# Patient Record
Sex: Male | Born: 1990 | Hispanic: No | Marital: Single | Smoking: Never smoker
Health system: Southern US, Community
[De-identification: ages and names within clinical notes are randomized; demographics above are authoritative.]

## PROBLEM LIST (undated history)

## (undated) DIAGNOSIS — T4145XA Adverse effect of unspecified anesthetic, initial encounter: Secondary | ICD-10-CM

## (undated) DIAGNOSIS — Z8489 Family history of other specified conditions: Secondary | ICD-10-CM

## (undated) DIAGNOSIS — T8859XA Other complications of anesthesia, initial encounter: Secondary | ICD-10-CM

## (undated) DIAGNOSIS — R112 Nausea with vomiting, unspecified: Secondary | ICD-10-CM

## (undated) DIAGNOSIS — Z789 Other specified health status: Secondary | ICD-10-CM

## (undated) DIAGNOSIS — Z9889 Other specified postprocedural states: Secondary | ICD-10-CM

## (undated) HISTORY — PX: ANTERIOR CRUCIATE LIGAMENT REPAIR: SHX115

---

## 2011-02-03 ENCOUNTER — Emergency Department (HOSPITAL_COMMUNITY)
Admission: EM | Admit: 2011-02-03 | Discharge: 2011-02-03 | Disposition: A | Discharge status: Home / Self Care | Payer: PRIVATE HEALTH INSURANCE | Attending: Emergency Medicine | Admitting: Emergency Medicine

## 2011-02-03 DIAGNOSIS — R079 Chest pain, unspecified: Secondary | ICD-10-CM | POA: Insufficient documentation

## 2011-02-03 DIAGNOSIS — I491 Atrial premature depolarization: Secondary | ICD-10-CM | POA: Insufficient documentation

## 2011-02-03 LAB — POCT I-STAT TROPONIN I: Troponin i, poc: 0.03 ng/mL (ref 0.00–0.08)

## 2011-02-03 LAB — COMPREHENSIVE METABOLIC PANEL
Albumin: 4.4 g/dL (ref 3.5–5.2)
BUN: 20 mg/dL (ref 6–23)
Creatinine, Ser: 0.99 mg/dL (ref 0.50–1.35)
Total Protein: 7.4 g/dL (ref 6.0–8.3)

## 2011-02-03 LAB — DIFFERENTIAL
Basophils Absolute: 0 10*3/uL (ref 0.0–0.1)
Basophils Relative: 0 % (ref 0–1)
Eosinophils Absolute: 0.1 10*3/uL (ref 0.0–0.7)
Monocytes Relative: 9 % (ref 3–12)
Neutrophils Relative %: 50 % (ref 43–77)

## 2011-02-03 LAB — CBC
MCH: 33.1 pg (ref 26.0–34.0)
MCHC: 34.9 g/dL (ref 30.0–36.0)
Platelets: 205 10*3/uL (ref 150–400)
RBC: 4.35 MIL/uL (ref 4.22–5.81)

## 2011-02-03 LAB — MAGNESIUM: Magnesium: 1.9 mg/dL (ref 1.5–2.5)

## 2011-02-05 ENCOUNTER — Emergency Department (HOSPITAL_COMMUNITY): Payer: PRIVATE HEALTH INSURANCE

## 2011-02-05 ENCOUNTER — Emergency Department (HOSPITAL_COMMUNITY)
Admission: EM | Admit: 2011-02-05 | Discharge: 2011-02-05 | Disposition: A | Discharge status: Home / Self Care | Payer: PRIVATE HEALTH INSURANCE | Attending: Emergency Medicine | Admitting: Emergency Medicine

## 2011-02-05 DIAGNOSIS — R002 Palpitations: Secondary | ICD-10-CM | POA: Insufficient documentation

## 2011-02-05 LAB — POCT I-STAT, CHEM 8
BUN: 22 mg/dL (ref 6–23)
Chloride: 100 mEq/L (ref 96–112)
Potassium: 4 mEq/L (ref 3.5–5.1)
Sodium: 151 mEq/L — ABNORMAL HIGH (ref 135–145)

## 2012-02-06 ENCOUNTER — Other Ambulatory Visit: Payer: Self-pay | Admitting: Sports Medicine

## 2012-02-06 DIAGNOSIS — M25561 Pain in right knee: Secondary | ICD-10-CM

## 2012-02-07 ENCOUNTER — Other Ambulatory Visit: Payer: PRIVATE HEALTH INSURANCE

## 2012-02-07 ENCOUNTER — Ambulatory Visit
Admission: RE | Admit: 2012-02-07 | Discharge: 2012-02-07 | Disposition: A | Discharge status: Home / Self Care | Payer: Federal, State, Local not specified - PPO | Source: Ambulatory Visit | Attending: Sports Medicine | Admitting: Sports Medicine

## 2012-02-07 DIAGNOSIS — M25561 Pain in right knee: Secondary | ICD-10-CM

## 2014-04-23 ENCOUNTER — Inpatient Hospital Stay (HOSPITAL_COMMUNITY)
Admission: AD | Admit: 2014-04-23 | Discharge: 2014-04-24 | DRG: 205 | Disposition: A | Discharge status: Home / Self Care | Payer: Federal, State, Local not specified - PPO | Source: Ambulatory Visit | Attending: Internal Medicine | Admitting: Internal Medicine

## 2014-04-23 ENCOUNTER — Encounter (HOSPITAL_COMMUNITY): Payer: Self-pay | Admitting: Radiology

## 2014-04-23 ENCOUNTER — Other Ambulatory Visit: Payer: Federal, State, Local not specified - PPO

## 2014-04-23 ENCOUNTER — Inpatient Hospital Stay (HOSPITAL_COMMUNITY): Payer: Federal, State, Local not specified - PPO

## 2014-04-23 DIAGNOSIS — J9601 Acute respiratory failure with hypoxia: Secondary | ICD-10-CM | POA: Diagnosis present

## 2014-04-23 DIAGNOSIS — R0902 Hypoxemia: Secondary | ICD-10-CM | POA: Diagnosis present

## 2014-04-23 DIAGNOSIS — Z9889 Other specified postprocedural states: Secondary | ICD-10-CM

## 2014-04-23 DIAGNOSIS — Y839 Surgical procedure, unspecified as the cause of abnormal reaction of the patient, or of later complication, without mention of misadventure at the time of the procedure: Secondary | ICD-10-CM | POA: Diagnosis present

## 2014-04-23 DIAGNOSIS — J9589 Other postprocedural complications and disorders of respiratory system, not elsewhere classified: Secondary | ICD-10-CM | POA: Diagnosis present

## 2014-04-23 DIAGNOSIS — R0600 Dyspnea, unspecified: Secondary | ICD-10-CM | POA: Diagnosis present

## 2014-04-23 HISTORY — DX: Adverse effect of unspecified anesthetic, initial encounter: T41.45XA

## 2014-04-23 HISTORY — DX: Other specified postprocedural states: Z98.890

## 2014-04-23 HISTORY — PX: ANTERIOR CRUCIATE LIGAMENT REPAIR: SHX115

## 2014-04-23 HISTORY — DX: Other specified health status: Z78.9

## 2014-04-23 HISTORY — DX: Other complications of anesthesia, initial encounter: T88.59XA

## 2014-04-23 HISTORY — DX: Nausea with vomiting, unspecified: R11.2

## 2014-04-23 HISTORY — DX: Family history of other specified conditions: Z84.89

## 2014-04-23 LAB — COMPREHENSIVE METABOLIC PANEL
ALBUMIN: 4.2 g/dL (ref 3.5–5.2)
ALT: 21 U/L (ref 0–53)
AST: 17 U/L (ref 0–37)
Alkaline Phosphatase: 51 U/L (ref 39–117)
Anion gap: 13 (ref 5–15)
BUN: 14 mg/dL (ref 6–23)
CO2: 25 mEq/L (ref 19–32)
CREATININE: 0.89 mg/dL (ref 0.50–1.35)
Calcium: 9.8 mg/dL (ref 8.4–10.5)
Chloride: 102 mEq/L (ref 96–112)
GFR calc Af Amer: >90 mL/min (ref >90)
GFR calc non Af Amer: >90 mL/min (ref >90)
Glucose, Bld: 116 mg/dL — ABNORMAL HIGH (ref 70–99)
Potassium: 3.8 mEq/L (ref 3.7–5.3)
Sodium: 140 mEq/L (ref 137–147)
Total Bilirubin: 0.4 mg/dL (ref 0.3–1.2)
Total Protein: 7.3 g/dL (ref 6.0–8.3)

## 2014-04-23 LAB — CBC WITH DIFFERENTIAL/PLATELET
Basophils Absolute: 0 10*3/uL (ref 0.0–0.1)
Basophils Relative: 0 % (ref 0–1)
EOS ABS: 0 10*3/uL (ref 0.0–0.7)
EOS PCT: 0 % (ref 0–5)
HEMATOCRIT: 40.1 % (ref 39.0–52.0)
Hemoglobin: 14.3 g/dL (ref 13.0–17.0)
LYMPHS ABS: 0.7 10*3/uL (ref 0.7–4.0)
LYMPHS PCT: 8 % — AB (ref 12–46)
MCH: 33.3 pg (ref 26.0–34.0)
MCHC: 35.7 g/dL (ref 30.0–36.0)
MCV: 93.3 fL (ref 78.0–100.0)
Monocytes Absolute: 0.4 10*3/uL (ref 0.1–1.0)
Monocytes Relative: 5 % (ref 3–12)
Neutro Abs: 7.8 10*3/uL — ABNORMAL HIGH (ref 1.7–7.7)
Neutrophils Relative %: 87 % — ABNORMAL HIGH (ref 43–77)
Platelets: 167 10*3/uL (ref 150–400)
RBC: 4.3 MIL/uL (ref 4.22–5.81)
RDW: 11.7 % (ref 11.5–15.5)
WBC: 9 10*3/uL (ref 4.0–10.5)

## 2014-04-23 LAB — LACTIC ACID, PLASMA: LACTIC ACID, VENOUS: 1 mmol/L (ref 0.5–2.2)

## 2014-04-23 LAB — APTT: aPTT: 27 seconds (ref 24–37)

## 2014-04-23 LAB — PROTIME-INR
INR: 1.04 (ref 0.00–1.49)
Prothrombin Time: 13.7 seconds (ref 11.6–15.2)

## 2014-04-23 LAB — TROPONIN I

## 2014-04-23 LAB — CK TOTAL AND CKMB (NOT AT ARMC)
CK TOTAL: 205 U/L (ref 7–232)
CK, MB: 1.8 ng/mL (ref 0.3–4.0)
Relative Index: 0.9 (ref 0.0–2.5)

## 2014-04-23 LAB — D-DIMER, QUANTITATIVE (NOT AT ARMC): D DIMER QUANT: 0.44 ug{FEU}/mL (ref 0.00–0.48)

## 2014-04-23 MED ORDER — TRAMADOL HCL 50 MG PO TABS
50.0000 mg | ORAL_TABLET | Freq: Four times a day (QID) | ORAL | Status: DC | PRN
  Administered 2014-04-23 – 2014-04-24 (×2): 50 mg via ORAL

## 2014-04-23 MED ORDER — SODIUM CHLORIDE 0.9 % IV SOLN
INTRAVENOUS | Status: DC
  Administered 2014-04-23 – 2014-04-24 (×2): via INTRAVENOUS

## 2014-04-23 MED ORDER — ACETAMINOPHEN 650 MG RE SUPP: 650.0000 mg | Freq: Four times a day (QID) | RECTAL | Status: DC | PRN

## 2014-04-23 MED ORDER — ENOXAPARIN SODIUM 30 MG/0.3ML ~~LOC~~ SOLN
30.0000 mg | Freq: Two times a day (BID) | SUBCUTANEOUS | Status: DC
  Administered 2014-04-23 – 2014-04-24 (×2): 30 mg via SUBCUTANEOUS
  Filled 2014-04-23 (×4): qty 0.3

## 2014-04-23 MED ORDER — SODIUM CHLORIDE 0.9 % IJ SOLN: 3.0000 mL | Freq: Two times a day (BID) | INTRAMUSCULAR | Status: DC

## 2014-04-23 MED ORDER — SODIUM CHLORIDE 0.9 % IJ SOLN: 3.0000 mL | INTRAMUSCULAR | Status: DC | PRN

## 2014-04-23 MED ORDER — IOHEXOL 350 MG/ML SOLN
100.0000 mL | Freq: Once | INTRAVENOUS | Status: AC | PRN
  Administered 2014-04-23: 90 mL via INTRAVENOUS

## 2014-04-23 MED ORDER — SODIUM CHLORIDE 0.9 % IV SOLN: 250.0000 mL | INTRAVENOUS | Status: DC | PRN

## 2014-04-23 MED ORDER — ACETAMINOPHEN 325 MG PO TABS
650.0000 mg | ORAL_TABLET | Freq: Four times a day (QID) | ORAL | Status: DC | PRN
Start: 2014-04-23 — End: 2014-04-24

## 2014-04-23 MED ORDER — ENOXAPARIN SODIUM 40 MG/0.4ML ~~LOC~~ SOLN
40.0000 mg | Freq: Every day | SUBCUTANEOUS | Status: DC
  Filled 2014-04-23: qty 0.4

## 2014-04-23 MED ORDER — FENTANYL CITRATE 0.05 MG/ML IJ SOLN
25.0000 ug | INTRAMUSCULAR | Status: DC | PRN
  Administered 2014-04-23: 100 ug via INTRAVENOUS
  Administered 2014-04-23: 50 ug via INTRAVENOUS
  Administered 2014-04-23 (×2): 25 ug via INTRAVENOUS
  Administered 2014-04-24: 75 ug via INTRAVENOUS
  Administered 2014-04-24: 50 ug via INTRAVENOUS
  Administered 2014-04-24: 100 ug via INTRAVENOUS
  Administered 2014-04-24: 50 ug via INTRAVENOUS
  Administered 2014-04-24: 100 ug via INTRAVENOUS

## 2014-04-23 NOTE — H&P (Signed)
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 Both of previous ACL repairs he had transient hypoxia and reports work up for hypoxia which were negative. He is admitted 12/4 post third ACL repair with another hypoxic event. He is now on room air with sats of 97%. He reports hearing the nurses telling him to take deep breaths. Suspect hypoventilation secondary to sedation bu we will check labs and xrays to rule out other causes. Had chest pain acute left anterior, non radiation. NO n/v. unknown if LMA used. No gerd or vomiting.  PAST MEDICAL HISTORY :  Irregular heart rate ACL repair x 3  Prior to Admission medications   Claritin Mobic   ALL: none  FAMILY HISTORY:  No hx of thromboembolic disease less than age 23 36randma 62 post op DVT htn dm, high cholesteral  SOCIAL HISTORY:   Never smoker. Soccer coach  REVIEW OF SYSTEMS:   10 point review of system taken, please see HPI for positives and negatives. Never smoker. Soccer coach  SUBJECTIVE: chest pain , knee pain  VITAL SIGNS:    PHYSICAL EXAMINATION: General:  WNWDAAMNAD on room air Neuro:  *Grossly intact HEENT:  No JVD/LAN Cardiovascular:  HSR RRR 1/6 sys murmur apex pan Lungs:  CTA Abdomen:  Soft + bs Musculoskeletal:  Rt long leg knee immobilizer in place. Rt foot warm and pink Skin:  Warm and dry  No results for input(s): NA, K, CL, CO2, BUN, CREATININE, GLUCOSE in the last 168 hours. No results for input(s): HGB, HCT, WBC, PLT in the last 168 hours. No results found.  ASSESSMENT /  PLAN: Active Problems:   S/P ACL repair   Acute respiratory failure with hypoxia, transient post ACL repair Etiology: narcs causing ATX, neg pressure edema unlikely, PE (would be odd so acute), does not present as anesthetic reaction   Discussion 23 yo with his third ACL repair right knee with transient hypoxia. Suspect hypoventilation form anesthetics. He has has workup for in past for same problem. PCCM asked to admit and evaluate. whats unclear is the continued chest pain    Check 12 lead  LEDS for dvt, although would be odd for such acute hypercoagulable state Chest Xray rule out aspiration / neg pressure edema ( was not decribed as diff airway) Anticoagulation for dvt prophylaxis with LMWH with transition to target specific prior to discharge. Pain control, avoid narcs as able May need cardiac workup  Steve Minor ACNP [facility] Pager [phone] till 3 pm If no answer page [phone] 04/23/2014, 2:24 PM  Pulmonary and Critical Care Medicine [facility] Pager: [phone]  04/23/2014, 2:15 PM  STAFF NOTE: I, Rory Percyaniel Feinstein, MD FACP have personally reviewed patient's available data, including medical history, events of note, physical examination and test results as part of my evaluation. I have discussed with resident/NP and other care providers such as pharmacist, RN and RRT. In addition, I personally evaluated patient and elicited key findings of: No distress, complains of left chest pain not  pleuritic, he has had a normal echo in past per mom, he is super active, pcxr just back without acute dz, will CT chest, dopplers, cpk, follow temp curve, pcxr in am, labs, ecg, continued pulse ox, O2 to sats 93%, DVT prev lov 30 bid (dw ortho)  Updated pt, and mom extensive  Steve Rossettianiel J. Tyson AliasFeinstein, MD, FACP Pgr: [phone] [facility] 04/23/2014 3:44 PM

## 2014-04-23 NOTE — Plan of Care (Signed)
Problem: Phase I Progression Outcomes Goal: O2 sats > or equal 90% or at baseline Outcome: Completed/Met Date Met:  04/23/14 Goal: Dyspnea controlled at rest Outcome: Completed/Met Date Met:  04/23/14 Goal: Tolerating diet Outcome: Completed/Met Date Met:  04/23/14

## 2014-04-23 NOTE — Progress Notes (Signed)
04/23/2014 1600 Verbal order Dr. Thurston HoleWainer ok to place CPM on patient starting at 70 degrees. Order enacted. Orders also received not to perform LE dopplers until tomorrow 12/5 and not to remove dressing until that time. Order enacted and information shared with on-coming RN. Will continue to monitor patient.  Keyshon Stein, Blanchard KelchStephanie Ingold

## 2014-04-23 NOTE — Progress Notes (Signed)
Orthopedic Tech Progress Note Patient Details:  Steve CunasBrandon Elliott 1990-11-24 [id number]  CPM Right Knee CPM Right Knee: On Right Knee Flexion (Degrees): 60 Right Knee Extension (Degrees): 0   Steve MoccasinHughes, Jenniger Figiel Craig 04/23/2014, 6:57 PM

## 2014-04-23 NOTE — Progress Notes (Signed)
04/23/2014 7:00 PM Nursing note Noted order for troponin I is for i-stat machine unavailable to the floor. Lab notified and orders placed for need for Lab troponin I phlebotomy to draw. Pt. Updated on plan of care.  Jeramy Dimmick, Blanchard KelchStephanie Ingold

## 2014-04-24 DIAGNOSIS — R0600 Dyspnea, unspecified: Secondary | ICD-10-CM | POA: Diagnosis present

## 2014-04-24 NOTE — Progress Notes (Signed)
Patient given discharge instructions, AVS,and medication list, patient has paper prescriptions given previously. Will discharge home as ordered. Graham Hyun, Randall AnKristin Jessup RN

## 2014-04-24 NOTE — Plan of Care (Signed)
Problem: Discharge Progression Outcomes Goal: Dyspnea controlled Outcome: Completed/Met Date Met:  04/24/14 Goal: O2 sats > or equal 90% or at baseline Outcome: Completed/Met Date Met:  04/24/14 Goal: Home O2 if indicated Outcome: Not Applicable Date Met:  88/11/03 Goal: Able to self administer respiratory meds Outcome: Not Applicable Date Met:  15/94/58 Goal: Flu vaccine received if indicated Outcome: Not Applicable Date Met:  59/29/24 Goal: Pneumonia vaccine received if indicated Outcome: Not Applicable Date Met:  46/28/63 Goal: Independent ADLs or Home Health Care Outcome: Completed/Met Date Met:  04/24/14 Goal: Barriers To Progression Addressed/Resolved Outcome: Completed/Met Date Met:  04/24/14 Goal: Discharge plan in place and appropriate Outcome: Completed/Met Date Met:  04/24/14 Goal: Pain controlled with appropriate interventions Outcome: Adequate for Discharge Goal: Tolerating diet Outcome: Completed/Met Date Met:  04/24/14 Goal: Activity appropriate for discharge plan Outcome: Adequate for Discharge Goal: Hemodynamically stable Outcome: Completed/Met Date Met:  81/77/11 Goal: Complications resolved/controlled Outcome: Completed/Met Date Met:  04/24/14

## 2014-04-24 NOTE — Progress Notes (Signed)
Subjective:    One day s/p right knee ACL reconstruction with post operative complications of chest pain, low O2 sat and shortness of breath.  Much better today Patient reports pain as 7 on 0-10 scale.    Objective: Vital signs in last 24 hours: Temp:  [97.4 F (36.3 C)-98.5 F (36.9 C)] 98.4 F (36.9 C) (12/05 0442) Pulse Rate:  [58-74] 74 (12/05 0442) Resp:  [18] 18 (12/05 0442) BP: (138-159)/(67-72) 138/72 mmHg (12/05 0505) SpO2:  [100 %] 100 % (12/05 0442)  Intake/Output from previous day: 12/04 0701 - 12/05 0700 In: 1385 [I.V.:1385] Out: -  Intake/Output this shift:     Recent Labs  04/23/14 1505  HGB 14.3    Recent Labs  04/23/14 1505  WBC 9.0  RBC 4.30  HCT 40.1  PLT 167    Recent Labs  04/23/14 1505  NA 140  K 3.8  CL 102  CO2 25  BUN 14  CREATININE 0.89  GLUCOSE 116*  CALCIUM 9.8    Recent Labs  04/23/14 1505  INR 1.04    ABD soft Neurovascular intact Sensation intact distally Intact pulses distally Dorsiflexion/Plantar flexion intact Incision: dressing C/D/I  Assessment Active Problems:   S/P ACL repair   Acute respiratory failure with hypoxia   Hypoxia   Plan: Advance diet  Spiral CT noted MILD left lower lobe atelectasis.  No PE.  Patient feeling better today.  Have ordered incentive spirometry for the patient to do in house and continue upon discharge.  Agree with discharge today from an orthopedic stand point.  Patient has Oxycodone and Valium scripts to cover post operative pain.  Home CPM has been arrange.  Company is [employer].  They have been in contact with patient's mother.  Has a post op appt with Dr Thurston HoleWainer on Thursday.  Steve Cathey A. Gwinda PasseShepperson, PA-C Physician Assistant [facility] [phone]  04/24/2014, 7:39 AM  Pascal LuxSHEPPERSON,Steve Malcomb J 04/24/2014, 7:35 AM

## 2014-04-24 NOTE — Discharge Instructions (Signed)
[facility] [address]   [address] [place], [place] [phone] A Division of [facility] Loreta Aveaniel F. [facility], M.D.     Robert A. Thurston HoleWainer, M.D.     Lunette StandsAnna Voytek, M.D. Eulas PostJoshua P. Landau, M.D.    Buford DresserWesley R. Ibazebo, M.D. Estell HarpinJames S. Kramer, M.D. Ralene Corkimothy R. Draper, D.O.          Genene ChurnJames M. Barry Dieneswens, PA-C            Avaleigh Decuir A. Emersyn Wyss, PA-C Janace LittenBrandon Parry, OPA-C  ARTHROSCOPIC SURGERY POSTOPERATIVE INSTRUCTIONS - KNEE/ACL  You have just had and arthroscopic operation. Even though your incisions (puncture sites) are small and should heal quickly the structures inside your knee may take 6-8 weeks to heal and settle down.  This healing time is variable for each patient and will depend on what was done inside the knee at the time of surgery.  PAIN MEDICATION You will be given a prescription for pain medication. Please take this medication as written. Most patients will require medication only for a few days.  SWELLING You can expect some swelling in your knee, this is normal. Applying EZY-GEL Wrap (cold packs) and elevating your leg will help keep the swelling to a minimum. Your entire leg should be elevated, not just your knee. Elevate your leg to or above the level of your waist.  The cold packs should be used constantly during the first 48 hours along with continued elevation of your leg.  After that ice for at least 1 hour, 4 times per day.  DRESSING Fluid leakage is common the first few days.  Precautions to prevent staining of your clothes, bed sheets, etc. should be taken.  The fluid was used to inflate the joint during surgery and is commonly tinged red from the small amount of blood. Some bleeding or leakage from the puncture sites may occur for a few days. Do not change your dressing. Do not remove the Steri-Strips. Do not shower or get the wound wet.  SYMPTOMS TO REPORT TO YOUR DOCTOR Extreme pain. Extreme swelling.  Temperature above 101 degrees. Change in the feeling, color or movement in your toes. Redness, heat or swelling at your puncture sites.  ACTIVITY You may bend and straighten your knee as soon as it is comfortable to do so. Using your knee will help decrease swelling and help prevent stiffness, as long as you don't overdo it.  Moving your foot up and down also helps decrease swelling. There is no harm is putting weight on your leg as long as it is comfortable to do so. You will be given crutches as well. (You should not, of course, try to run or jump.)  Gradually increase your activity as comfort allows. Let your body be your guide as to how much activity you can tolerate. An increase in pain or swelling with certain activities or with increased activities may indicate you are doing too much. Back up and start building up your activity at a slower rate. Crutches and use of an immobilizer temporarily rest your knee joint, helping it to heal properly.  Use your crutches and wear your knee immobilizer whenever walking.  You may, however, put weight on your affected leg as tolerated when in the immobilizer. Remember, your successful return to normal and sporting activities depends largely on your commitment to rehabilitation and maintaining strength in your leg.  Your formal physical therapy program will start soon, but your time to begin is now. Perform  quad sets (tightening your thigh muscle) a minimum of twenty times, three sessions a day. In addition work on straight leg raises (tighten your muscle and lift your straight leg 6-8 inches off the bed for 5 seconds). Perform this routine 10-20 times during your minimum of 3 daily exercise sessions. Patellar motion helps prevent scar tissue around your kneecap.  Move your kneecap (patella) from side-to-side several times a day. Use your CPM for 6 hours a day. Start at 0 to 60 degrees increasing by 10 degrees a day until you reach 90 degrees.  To work on knee  extension, place a folded pillow under your ankle (never place pillow under just the knee) allowing your hamstrings to stretch up to 30 minutes a day.  OFFICE CHECK-UP If no major problems arise and you are progressing well we will need to see you in one week. Please call the office to make an appointment. We will remove your sutures and discuss our surgery and rehabilitation at that time.

## 2014-04-24 NOTE — Discharge Summary (Signed)
Physician Discharge Summary  Patient ID: Steve Elliott MRN: [id number] DOB/AGE: 1990/10/31 23 y.o.  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 Both of previous ACL repairs he had transient hypoxia and reports work up for hypoxia which were negative. He is admitted 12/4 post third ACL repair with another hypoxic event. He is now on room air with sats of 97%. He reports hearing the nurses telling him to take deep breaths. Suspect hypoventilation secondary to sedation bu we will check labs and xrays to rule out other causes. Had chest pain acute left anterior, non radiation. NO n/v. unknown if LMA used. No gerd or vomiting. Hospital Course:  ASSESSMENT / PLAN: Active Problems:  S/P ACL repair  Acute respiratory failure with hypoxia, transient post ACL repair Etiology: narcs causing ATX, neg pressure edema unlikely, PE (would be odd so acute), does not present as anesthetic reaction   Discussion 23 yo with his third ACL repair right knee with transient hypoxia. Suspect hypoventilation form anesthetics. He has has workup for in past for same problem. PCCM asked to admit and evaluate. whats unclear is the continued chest pain    Check 12 lead with sinus arrhythmia.  Chest Xray rule out aspiration / neg pressure edema ( was not decribed as diff airway)Neg CT chest neg. Anticoagulation for dvt prophylaxis with LMWH with transition to target specific prior to discharge. Anticoagulation not needed per orthopedics instruction. Pain control, avoid narcs as able per orthopedics. 12./5 dc home with no need to follow up with pulmonary.   Labs at discharge Lab Results  Component Value Date   CREATININE 0.89 04/23/2014   BUN 14 04/23/2014   NA 140  04/23/2014   K 3.8 04/23/2014   CL 102 04/23/2014   CO2 25 04/23/2014   Lab Results  Component Value Date   WBC 9.0 04/23/2014   HGB 14.3 04/23/2014   HCT 40.1 04/23/2014   MCV 93.3 04/23/2014   PLT 167 04/23/2014   Lab Results  Component Value Date   ALT 21 04/23/2014   AST 17 04/23/2014   ALKPHOS 51 04/23/2014   BILITOT 0.4 04/23/2014   Lab Results  Component Value Date   INR 1.04 04/23/2014    Current radiology studies Ct Angio Chest Pe W/cm &/or Wo Cm  04/23/2014   CLINICAL DATA:  Acute hypoxia following knee surgery, acute chest pressure, shortness of breath  EXAM: CT ANGIOGRAPHY CHEST WITH CONTRAST  TECHNIQUE: Multidetector CT imaging of the chest was performed using the standard protocol during bolus administration of intravenous contrast. Multiplanar CT image reconstructions and MIPs were obtained to evaluate the vascular anatomy.  CONTRAST:  90mL OMNIPAQUE IOHEXOL 350 MG/ML SOLN  COMPARISON:  04/23/2014  FINDINGS: Pulmonary arteries appear patent with no significant proximal or central hilar filling defect or pulmonary embolus. Intact thoracic aorta. No dissection. Normal heart size. No pericardial or pleural effusion. No adenopathy.  Lung windows demonstrate no acute in airspace process, pneumonia, collapse or consolidation. No interstitial process or edema. Negative pneumothorax. Minor left lower lobe subsegmental atelectasis.  Upper abdomen: No acute finding. Portion of the colon splenic flexure is superimposed between the spleen and left kidney in the left upper quadrant beneath the diaphragm.  Review of  the MIP images confirms the above findings.  IMPRESSION: Negative for significant acute pulmonary embolus by CTA.  Minor left lower lobe atelectasis.  No acute intrathoracic findings.   Electronically Signed   By: Ruel Favorsrevor  Shick M.D.   On: 04/23/2014 18:04   Portable Chest 1 View  04/23/2014   CLINICAL DATA:  Shortness of breath.  Recent knee surgery.  Hypoxia.  EXAM:  PORTABLE CHEST - 1 VIEW  COMPARISON:  02/05/2011  FINDINGS: Upper normal heart size with cardiothoracic index 54% on AP radiography.  The lungs appear clear.  No pleural effusion identified.  IMPRESSION: No active disease.   Electronically Signed   By: Herbie BaltimoreWalt  Liebkemann M.D.   On: 04/23/2014 15:13    Disposition:  01-Home or Self Care  Discharge Instructions    Discharge patient    Complete by:  As directed             Medication List    TAKE these medications        acetaminophen 500 MG tablet  Commonly known as:  TYLENOL  Take 500 mg by mouth every 6 (six) hours as needed for mild pain.           Follow-up Information    Follow up with Nilda SimmerWAINER,ROBERT A, MD On 04/29/2014.   Specialty:  Orthopedic Surgery   Why:  as schedule   Contact information:   [address] [place] [address] [phone]        Discharged Condition: good  Time spent on discharge greater than 40 minutes.  Vital signs at Discharge. Temp:  [97.4 F (36.3 C)-98.5 F (36.9 C)] 98.4 F (36.9 C) (12/05 0442) Pulse Rate:  [58-74] 74 (12/05 0442) Resp:  [18] 18 (12/05 0442) BP: (138-159)/(67-72) 138/72 mmHg (12/05 0505) SpO2:  [100 %] 100 % (12/05 0442) Office follow up Special Information or instructions. Follow up with Dr. Thurston HoleWainer as instucted. Signed: Brett CanalesSteve Minor ACNP [facility] Pager [phone] till 3 pm If no answer page [phone] 04/24/2014, 11:25 AM  Patient has been seen and examined today.  Ct chest unremarkable except for mild atx.  Lungs totally clear today, rrr.  I think he is stable for discharge from pulmonary standpoint.  Ortho has cleared as well.  Does not require pulmonary f/u as outpt.

## 2016-05-05 IMAGING — CR DG CHEST 1V PORT
1 series · 1 of 1 positions shown · non-contrast
Comparison: 02/05/2011

CLINICAL DATA: Shortness of breath.  Recent knee surgery.  Hypoxia.

EXAM:
PORTABLE CHEST - 1 VIEW

[AP]
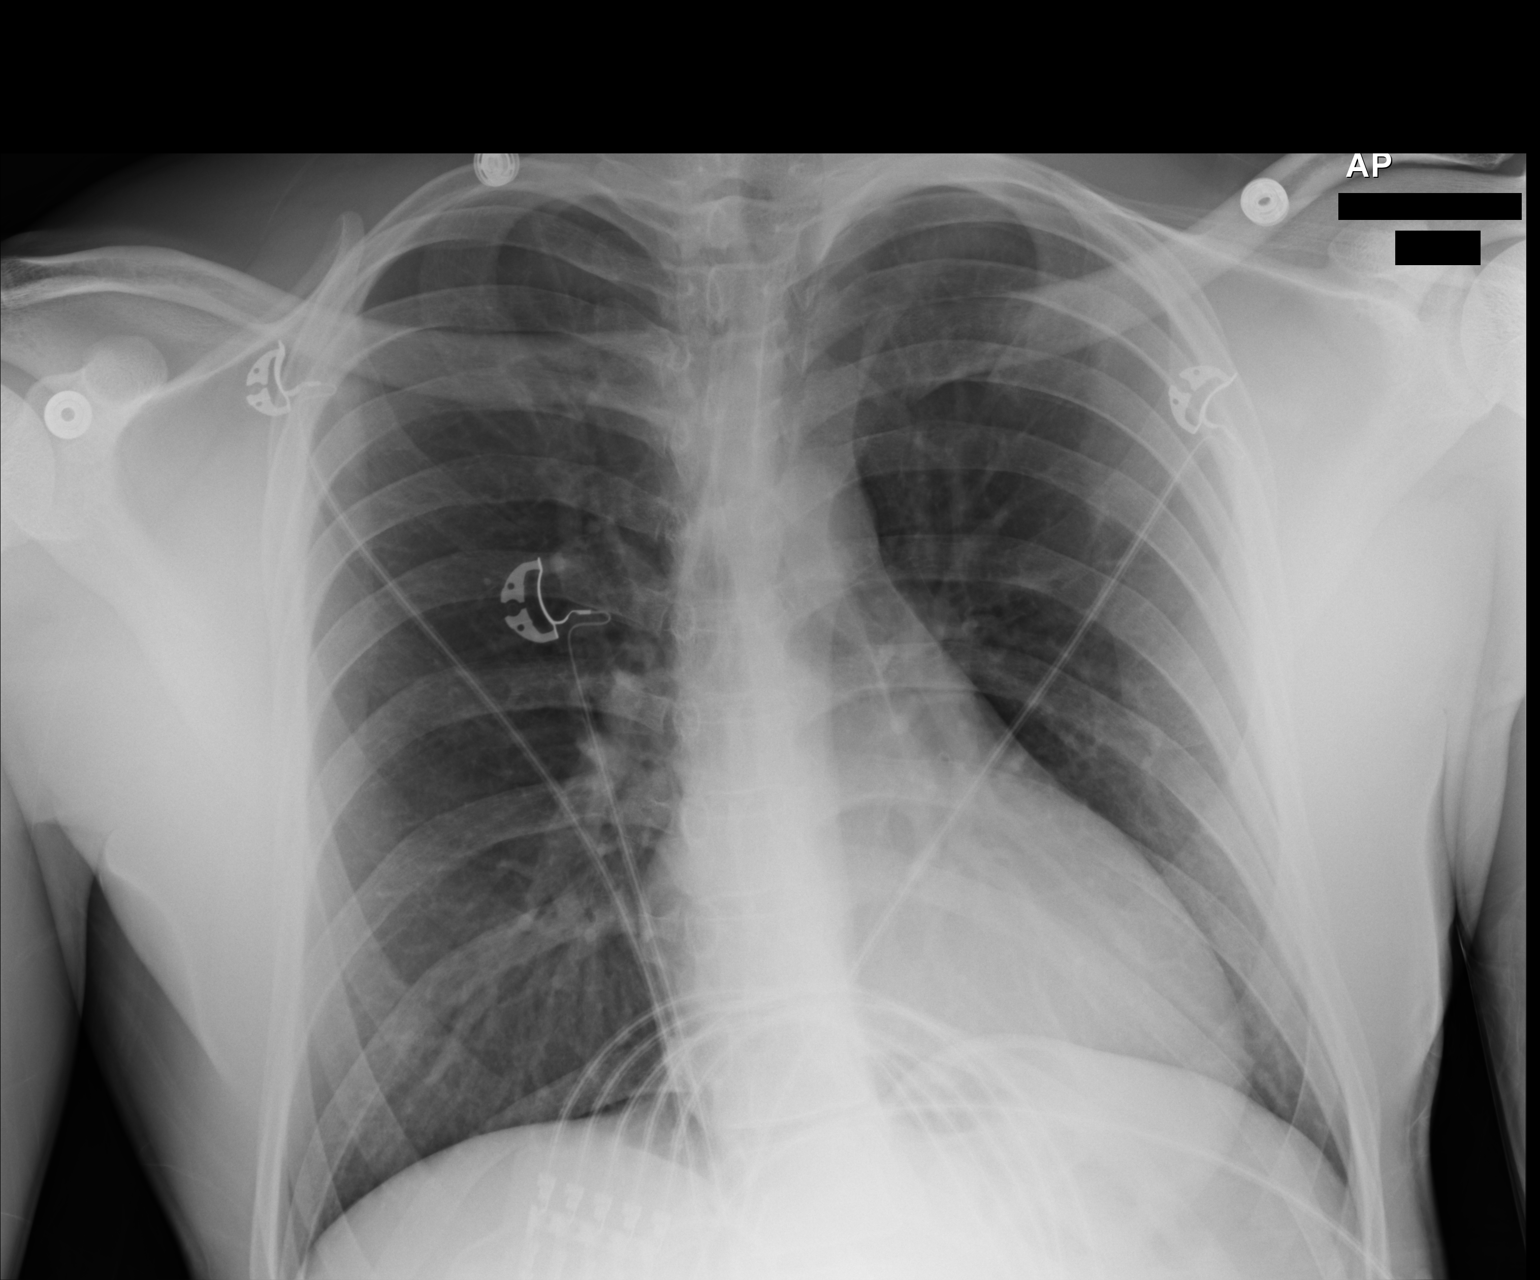

[1 of 1 positions shown; findings below may reference images not displayed]

FINDINGS: Upper normal heart size with cardiothoracic index 54% on AP
radiography.

The lungs appear clear.  No pleural effusion identified.
IMPRESSION: No active disease.

## 2016-05-05 IMAGING — CT CT ANGIO CHEST
2 of 8 series · 19 of 46 positions shown · IV contrast (Omni 300)
Comparison: 04/23/2014

CLINICAL DATA: Acute hypoxia following knee surgery, acute chest
pressure, shortness of breath

EXAM:
CT ANGIOGRAPHY CHEST WITH CONTRAST
TECHNIQUE: Multidetector CT imaging of the chest was performed using the
standard protocol during bolus administration of intravenous
contrast. Multiplanar CT image reconstructions and MIPs were
obtained to evaluate the vascular anatomy.
CONTRAST:  90mL OMNIPAQUE IOHEXOL 350 MG/ML SOLN

[Series 5: thins · axial · 0.57mm/px · z∈[+1513,+1776]mm · 16 of 291 slices shown]
[im 14/291  lung]
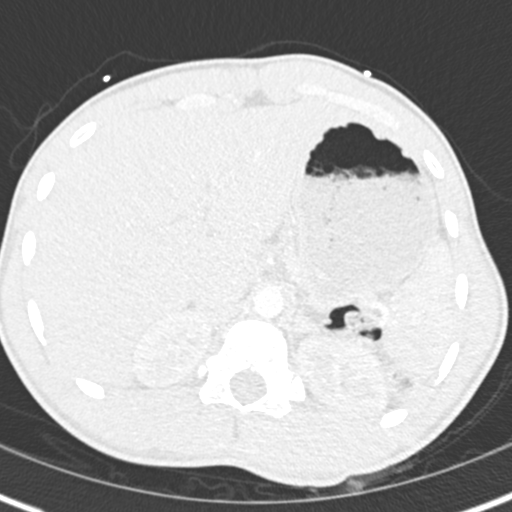
[im 27/291  soft-tissue]
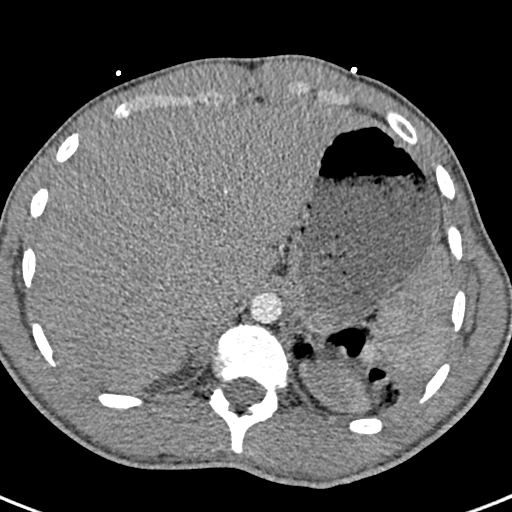
[im 53/291  lung]
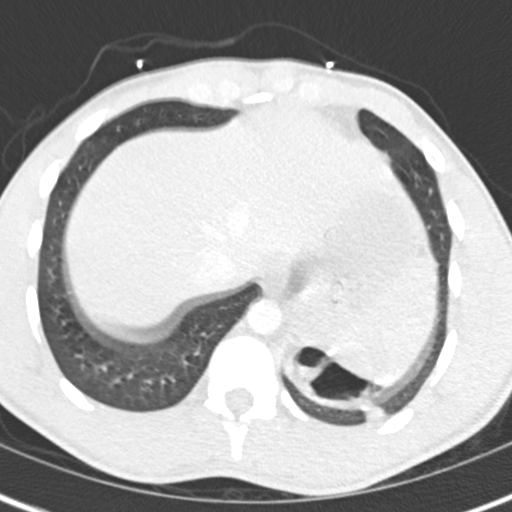
[im 66/291  soft-tissue]
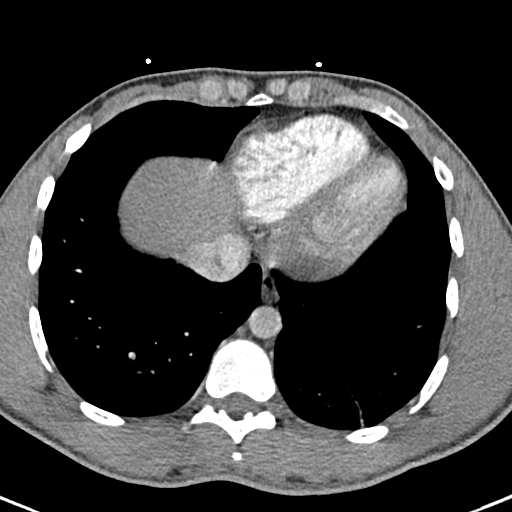
[im 80/291  lung]
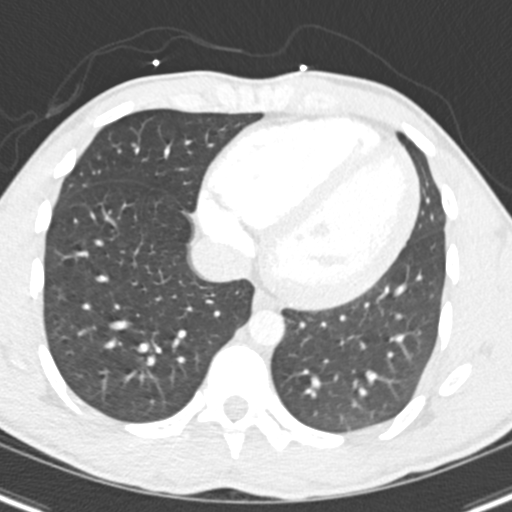
[im 106/291  soft-tissue]
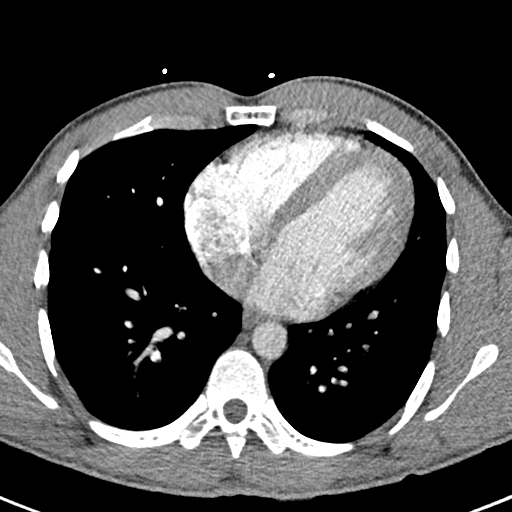
[im 119/291  lung]
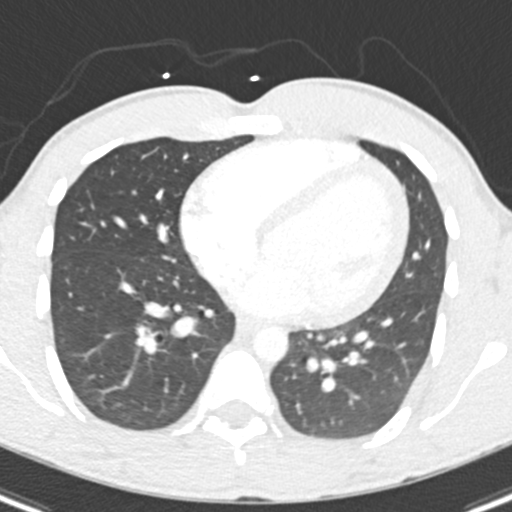
[im 132/291  soft-tissue]
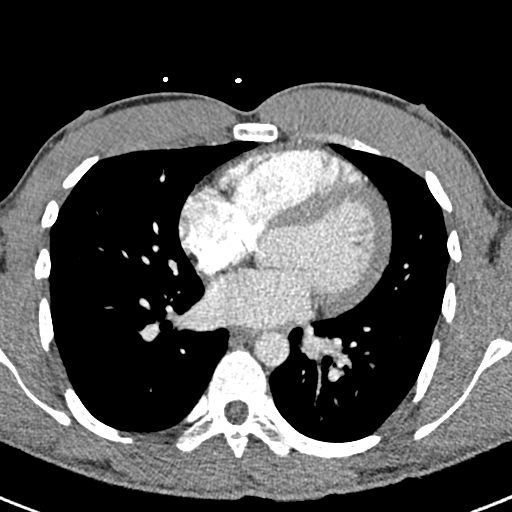
[im 159/291  lung]
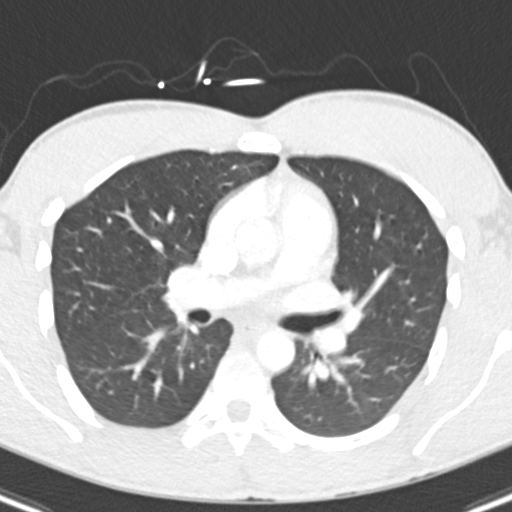
[im 172/291  soft-tissue]
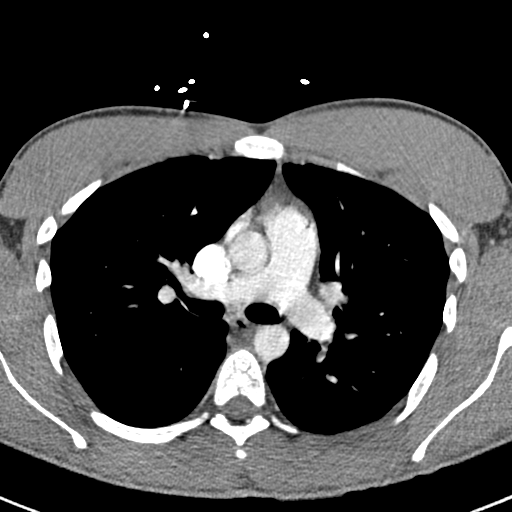
[im 185/291  lung]
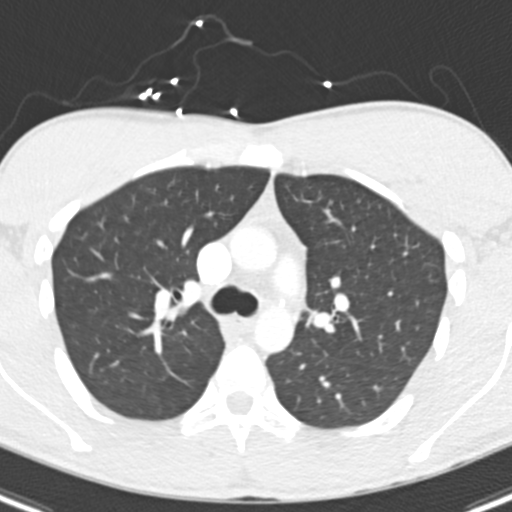
[im 211/291  soft-tissue]
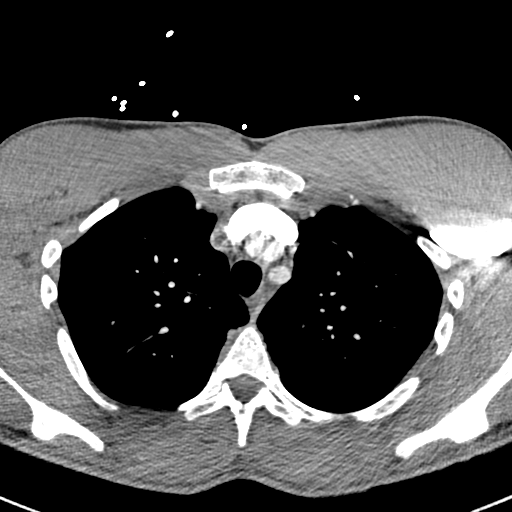
[im 225/291  lung]
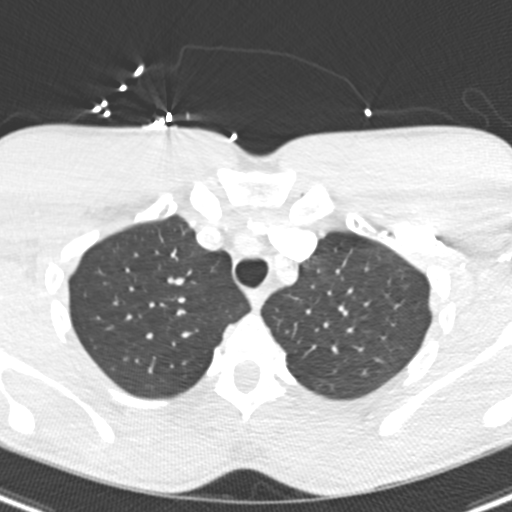
[im 238/291  soft-tissue]
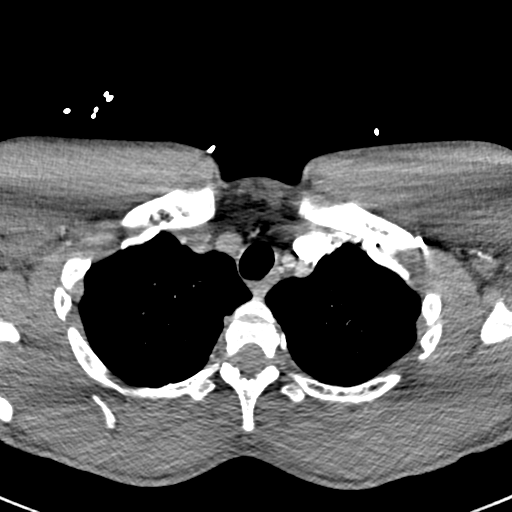
[im 264/291  lung]
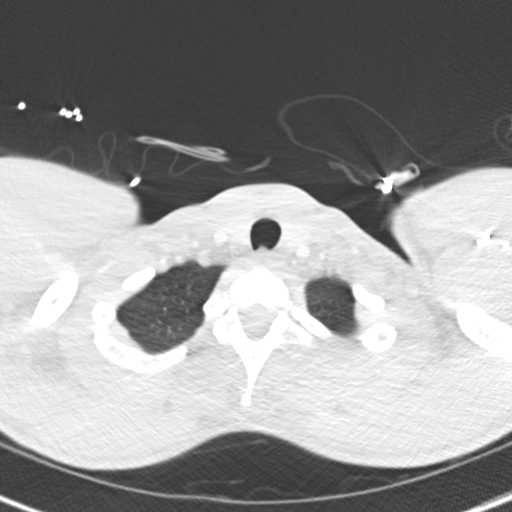
[im 277/291  soft-tissue]
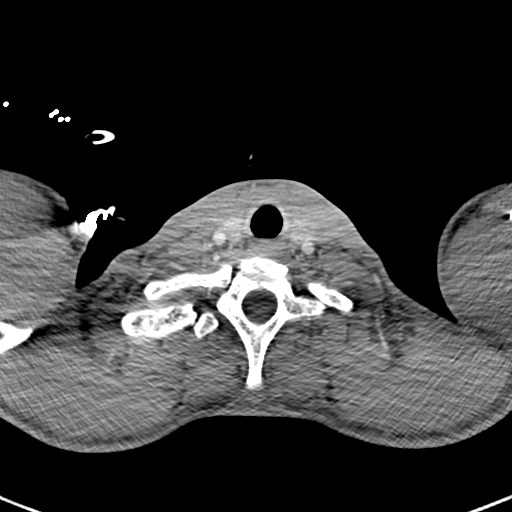

[Series 7: coronal mpr · coronal · 0.59mm/px · 3 of 109 slices shown]
[im 28/109  soft-tissue]
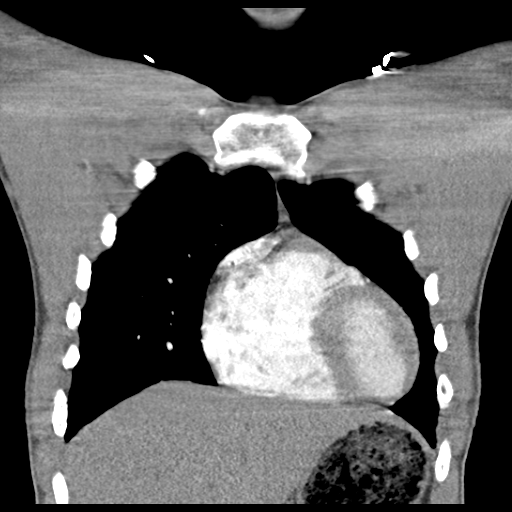
[im 55/109  soft-tissue]
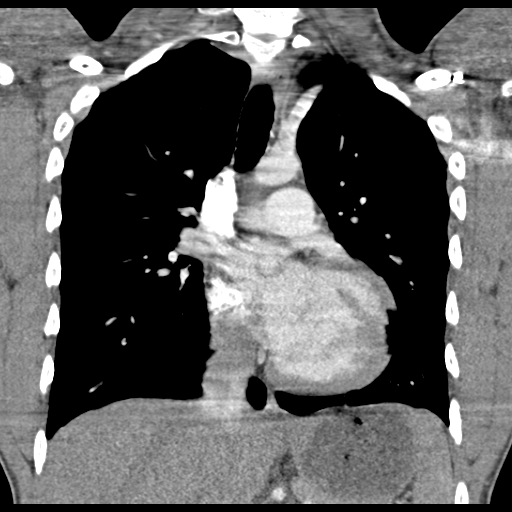
[im 82/109  soft-tissue]
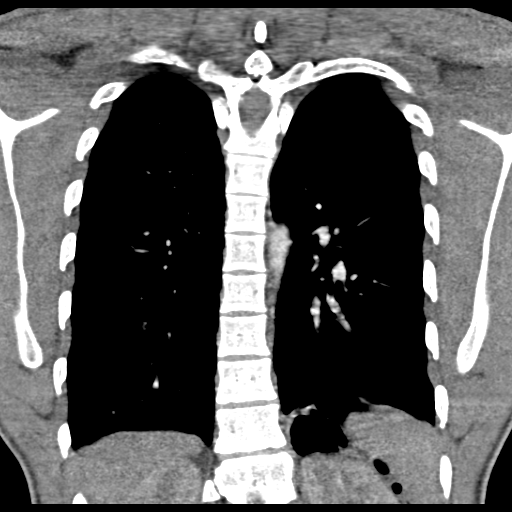

[19 of 46 positions shown; findings below may reference images not displayed]

FINDINGS: Pulmonary arteries appear patent with no significant proximal or
central hilar filling defect or pulmonary embolus. Intact thoracic
aorta. No dissection. Normal heart size. No pericardial or pleural
effusion. No adenopathy.

Lung windows demonstrate no acute in airspace process, pneumonia,
collapse or consolidation. No interstitial process or edema.
Negative pneumothorax. Minor left lower lobe subsegmental
atelectasis.

Upper abdomen: No acute finding. Portion of the colon splenic
flexure is superimposed between the spleen and left kidney in the
left upper quadrant beneath the diaphragm.

Review of the MIP images confirms the above findings.
IMPRESSION: Negative for significant acute pulmonary embolus by CTA.

Minor left lower lobe atelectasis.  No acute intrathoracic findings.
